# Patient Record
Sex: Male | Born: 1955 | Race: Black or African American | Hispanic: No | Smoking: Never smoker
Health system: Southern US, Community
[De-identification: ages and names within clinical notes are randomized; demographics above are authoritative.]

---

## 2015-10-03 ENCOUNTER — Observation Stay (HOSPITAL_COMMUNITY)
Admission: EM | Admit: 2015-10-03 | Discharge: 2015-10-04 | Disposition: A | Discharge status: Home / Self Care | Payer: Non-veteran care | Attending: General Surgery | Admitting: General Surgery

## 2015-10-03 ENCOUNTER — Emergency Department (HOSPITAL_COMMUNITY): Payer: Non-veteran care

## 2015-10-03 ENCOUNTER — Encounter (HOSPITAL_COMMUNITY): Payer: Self-pay | Admitting: Emergency Medicine

## 2015-10-03 DIAGNOSIS — J982 Interstitial emphysema: Secondary | ICD-10-CM | POA: Diagnosis not present

## 2015-10-03 DIAGNOSIS — S129XXA Fracture of neck, unspecified, initial encounter: Secondary | ICD-10-CM | POA: Diagnosis present

## 2015-10-03 DIAGNOSIS — S12601A Unspecified nondisplaced fracture of seventh cervical vertebra, initial encounter for closed fracture: Secondary | ICD-10-CM

## 2015-10-03 DIAGNOSIS — Z880 Allergy status to penicillin: Secondary | ICD-10-CM | POA: Insufficient documentation

## 2015-10-03 DIAGNOSIS — W14XXXA Fall from tree, initial encounter: Secondary | ICD-10-CM | POA: Diagnosis not present

## 2015-10-03 DIAGNOSIS — S2232XA Fracture of one rib, left side, initial encounter for closed fracture: Principal | ICD-10-CM

## 2015-10-03 DIAGNOSIS — W11XXXA Fall on and from ladder, initial encounter: Secondary | ICD-10-CM

## 2015-10-03 DIAGNOSIS — W1789XA Other fall from one level to another, initial encounter: Secondary | ICD-10-CM

## 2015-10-03 LAB — COMPREHENSIVE METABOLIC PANEL
ALT: 27 U/L (ref 17–63)
ANION GAP: 7 (ref 5–15)
AST: 41 U/L (ref 15–41)
Albumin: 3.7 g/dL (ref 3.5–5.0)
Alkaline Phosphatase: 65 U/L (ref 38–126)
BILIRUBIN TOTAL: 0.6 mg/dL (ref 0.3–1.2)
BUN: 22 mg/dL — AB (ref 6–20)
CO2: 24 mmol/L (ref 22–32)
Calcium: 8.9 mg/dL (ref 8.9–10.3)
Chloride: 106 mmol/L (ref 101–111)
Creatinine, Ser: 1.31 mg/dL — ABNORMAL HIGH (ref 0.61–1.24)
GFR, EST NON AFRICAN AMERICAN: 58 mL/min — AB (ref >60)
Glucose, Bld: 145 mg/dL — ABNORMAL HIGH (ref 65–99)
POTASSIUM: 3.6 mmol/L (ref 3.5–5.1)
Sodium: 137 mmol/L (ref 135–145)
TOTAL PROTEIN: 6.1 g/dL — AB (ref 6.5–8.1)

## 2015-10-03 LAB — CBC WITH DIFFERENTIAL/PLATELET
BASOS ABS: 0 10*3/uL (ref 0.0–0.1)
Basophils Relative: 0 %
EOS ABS: 0.1 10*3/uL (ref 0.0–0.7)
EOS PCT: 1 %
HCT: 37.9 % — ABNORMAL LOW (ref 39.0–52.0)
Hemoglobin: 12.5 g/dL — ABNORMAL LOW (ref 13.0–17.0)
LYMPHS PCT: 10 %
Lymphs Abs: 0.7 10*3/uL (ref 0.7–4.0)
MCH: 28.3 pg (ref 26.0–34.0)
MCHC: 33 g/dL (ref 30.0–36.0)
MCV: 85.9 fL (ref 78.0–100.0)
Monocytes Absolute: 0.3 10*3/uL (ref 0.1–1.0)
Monocytes Relative: 4 %
Neutro Abs: 6.2 10*3/uL (ref 1.7–7.7)
Neutrophils Relative %: 85 %
PLATELETS: 200 10*3/uL (ref 150–400)
RBC: 4.41 MIL/uL (ref 4.22–5.81)
RDW: 11.9 % (ref 11.5–15.5)
WBC: 7.3 10*3/uL (ref 4.0–10.5)

## 2015-10-03 LAB — PROTIME-INR
INR: 1.1 (ref 0.00–1.49)
PROTHROMBIN TIME: 14.3 s (ref 11.6–15.2)

## 2015-10-03 MED ORDER — ONDANSETRON HCL 4 MG PO TABS: 4.0000 mg | ORAL_TABLET | Freq: Four times a day (QID) | ORAL | Status: DC | PRN

## 2015-10-03 MED ORDER — ENOXAPARIN SODIUM 40 MG/0.4ML ~~LOC~~ SOLN: 40.0000 mg | SUBCUTANEOUS | Status: DC

## 2015-10-03 MED ORDER — HYDROCODONE-ACETAMINOPHEN 10-325 MG PO TABS
0.5000 | ORAL_TABLET | ORAL | Status: DC | PRN
  Filled 2015-10-03: qty 2

## 2015-10-03 MED ORDER — KCL IN DEXTROSE-NACL 20-5-0.45 MEQ/L-%-% IV SOLN
INTRAVENOUS | Status: DC
  Administered 2015-10-03: 21:00:00 via INTRAVENOUS
  Filled 2015-10-03: qty 1000

## 2015-10-03 MED ORDER — DOCUSATE SODIUM 100 MG PO CAPS
100.0000 mg | ORAL_CAPSULE | Freq: Two times a day (BID) | ORAL | Status: DC
  Administered 2015-10-03 – 2015-10-04 (×2): 100 mg via ORAL
  Filled 2015-10-03 (×2): qty 1

## 2015-10-03 MED ORDER — IOPAMIDOL (ISOVUE-300) INJECTION 61%
INTRAVENOUS | Status: AC
  Administered 2015-10-03: 100 mL
  Filled 2015-10-03: qty 100

## 2015-10-03 MED ORDER — HYDROMORPHONE HCL 1 MG/ML IJ SOLN
1.0000 mg | INTRAMUSCULAR | Status: DC | PRN
  Administered 2015-10-03 – 2015-10-04 (×2): 1 mg via INTRAVENOUS
  Filled 2015-10-03 (×2): qty 1

## 2015-10-03 MED ORDER — SODIUM CHLORIDE 0.9 % IV SOLN
INTRAVENOUS | Status: DC
  Administered 2015-10-03: 125 mL/h via INTRAVENOUS

## 2015-10-03 MED ORDER — ONDANSETRON HCL 4 MG/2ML IJ SOLN
4.0000 mg | Freq: Four times a day (QID) | INTRAMUSCULAR | Status: DC | PRN
  Administered 2015-10-04: 4 mg via INTRAVENOUS
  Filled 2015-10-03: qty 2

## 2015-10-03 MED ORDER — MORPHINE SULFATE (PF) 2 MG/ML IV SOLN
2.0000 mg | INTRAVENOUS | Status: DC | PRN
  Administered 2015-10-03: 2 mg via INTRAVENOUS
  Filled 2015-10-03: qty 1

## 2015-10-03 MED ORDER — TRAMADOL HCL 50 MG PO TABS
50.0000 mg | ORAL_TABLET | Freq: Four times a day (QID) | ORAL | Status: DC | PRN
  Administered 2015-10-03: 100 mg via ORAL
  Filled 2015-10-03: qty 2

## 2015-10-03 NOTE — ED Notes (Signed)
Fall from "40 feet" C/Ax4.  Neck and side pain.

## 2015-10-03 NOTE — ED Notes (Signed)
POV, reports 40 foot fall from tree to soft ground, no LOC/vomitig, c/o neck pain (c-collar placed on arrival) and right sided neck pain, LS clear, sats 100%, ambulatory on arrival, neuro intact, VSS

## 2015-10-03 NOTE — H&P (Signed)
Jay White is an 60 y.o. male.   Chief Complaint: Fall HPI: Jay White was ~40 feet up a ladder cutting some limbs when a branch hit the ladder and knocked him off. He landed on soft ground on his back and neck. He did not lose consciousness nor does he have any amnesia. He had the wind knocked out of him but was able to stand and ambulate though he was somewhat dizzy. His son called EMS and he was brought to the ED as a level 2 trauma activation. He c/o some left back pain mainly.  History reviewed. No pertinent past medical history.  History reviewed. No pertinent past surgical history.  No family history on file. Social History:  reports that he has never smoked. He does not have any smokeless tobacco history on file. He reports that he drinks alcohol. His drug history is not on file.  Allergies:  Allergies  Allergen Reactions  . Codeine Nausea Only  . Penicillins Nausea And Vomiting    Results for orders placed or performed during the hospital encounter of 10/03/15 (from the past 48 hour(s))  Comprehensive metabolic panel     Status: Abnormal   Collection Time: 10/03/15  1:00 PM  Result Value Ref Range   Sodium 137 135 - 145 mmol/L   Potassium 3.6 3.5 - 5.1 mmol/L   Chloride 106 101 - 111 mmol/L   CO2 24 22 - 32 mmol/L   Glucose, Bld 145 (H) 65 - 99 mg/dL   BUN 22 (H) 6 - 20 mg/dL   Creatinine, Ser 1.31 (H) 0.61 - 1.24 mg/dL   Calcium 8.9 8.9 - 10.3 mg/dL   Total Protein 6.1 (L) 6.5 - 8.1 g/dL   Albumin 3.7 3.5 - 5.0 g/dL   AST 41 15 - 41 U/L   ALT 27 17 - 63 U/L   Alkaline Phosphatase 65 38 - 126 U/L   Total Bilirubin 0.6 0.3 - 1.2 mg/dL   GFR calc non Af Amer 58 (L) >60 mL/min   GFR calc Af Amer >60 >60 mL/min    Comment: (NOTE) The eGFR has been calculated using the CKD EPI equation. This calculation has not been validated in all clinical situations. eGFR's persistently <60 mL/min signify possible Chronic Kidney Disease.    Anion gap 7 5 - 15  CBC with Differential      Status: Abnormal   Collection Time: 10/03/15  1:00 PM  Result Value Ref Range   WBC 7.3 4.0 - 10.5 K/uL   RBC 4.41 4.22 - 5.81 MIL/uL   Hemoglobin 12.5 (L) 13.0 - 17.0 g/dL   HCT 37.9 (L) 39.0 - 52.0 %   MCV 85.9 78.0 - 100.0 fL   MCH 28.3 26.0 - 34.0 pg   MCHC 33.0 30.0 - 36.0 g/dL   RDW 11.9 11.5 - 15.5 %   Platelets 200 150 - 400 K/uL   Neutrophils Relative % 85 %   Neutro Abs 6.2 1.7 - 7.7 K/uL   Lymphocytes Relative 10 %   Lymphs Abs 0.7 0.7 - 4.0 K/uL   Monocytes Relative 4 %   Monocytes Absolute 0.3 0.1 - 1.0 K/uL   Eosinophils Relative 1 %   Eosinophils Absolute 0.1 0.0 - 0.7 K/uL   Basophils Relative 0 %   Basophils Absolute 0.0 0.0 - 0.1 K/uL  Protime-INR     Status: None   Collection Time: 10/03/15  1:00 PM  Result Value Ref Range   Prothrombin Time 14.3 11.6 - 15.2  seconds   INR 1.10 0.00 - 1.49   Ct Chest W Contrast  10/03/2015  CLINICAL DATA:  Pain after 40 foot fall. EXAM: CT CHEST, ABDOMEN, AND PELVIS WITH CONTRAST TECHNIQUE: Multidetector CT imaging of the chest, abdomen and pelvis was performed following the standard protocol during bolus administration of intravenous contrast. CONTRAST:  124m ISOVUE-300 IOPAMIDOL (ISOVUE-300) INJECTION 61% COMPARISON:  None. FINDINGS: CT CHEST There is air in the neck just to the right and posterior to the trachea on series 4, image 8. There is also abnormal air in the subcarinal region as seen on series 4, image 54. There is a focus of gas in the mediastinum posterior to the aorta on series 4, image 107 and another on series 4, 120. There is a tiny amount of gas in the retrocrural region anterior to the aorta on series 3, image 48. A focus of air anterior to the aorta on sagittal image 91 could be a small amount of pleural air. No abnormal fluid collections are seen in the mediastinum. The remainder of the central airways are normal. There is no other evidence of pneumothorax. There is mild dependent atelectasis bilaterally. No  suspicious pulmonary nodules, masses, or infiltrates are identified. The study was not tailored to evaluate the thoracic aorta but there is no aneurysm or dissection identified. No pleural or pericardial effusions. Mild coronary artery calcifications are seen. The heart is otherwise normal. No adenopathy is seen in the chest. CT ABDOMEN AND PELVIS Evaluation of the abdomen demonstrates a tiny amount of retrocrural bear as described above. This is probably tracking inferiorly from the chest. No other abnormal air seen in the abdomen. No free fluid. The liver, gallbladder, portal vein, spleen, adrenal glands, kidneys, and pancreas are normal. The abdominal aorta is normal in caliber. No adenopathy. The stomach and small bowel are normal. The colon demonstrates mild fecal loading but is otherwise normal. The appendix is not well seen but there is no secondary evidence of appendicitis. The pelvis demonstrates no adenopathy or mass. The bladder is normal. Delayed imaging through the upper abdomen demonstrates no filling defects in the upper renal collecting systems. There is a mildly displaced fracture of the left posterior tenth rib. Degenerative changes are seen at the right sternoclavicular joint. There are degenerative changes within the spine. No other fractures are identified. IMPRESSION: 1. There is abnormal air in the mediastinum. The largest pocket of air is in the subcarinal region adjacent to the trachea. However, there is no obvious source of air identified and no obvious injury to the esophagus or trachea is seen. Recommend clinical correlation and follow-up as clinically warranted. No abnormal fluid in the mediastinum. The air from the chest tracks inferiorly into the retrocrural region as described above. 2. Left posterior tenth rib fracture. 3. There is a tiny focus of air medially in the left chest described above which may represent a tiny focus of pleural air representing a tiny pneumothorax best seen  on coronal image 63. Findings called to Dr. PJohnney Killian Electronically Signed   By: DDorise BullionIII M.D   On: 10/03/2015 14:36   Ct Cervical Spine Wo Contrast  10/03/2015  CLINICAL DATA:  Status post fall 40 feet off a ladder. EXAM: CT CERVICAL SPINE WITHOUT CONTRAST TECHNIQUE: Multidetector CT imaging of the cervical spine was performed without intravenous contrast. Multiplanar CT image reconstructions were also generated. COMPARISON:  None. FINDINGS: The alignment is anatomic. There is a mild acute burst fracture of the C7 vertebral body with  minimal anterior height loss without significant retropulsion of the posterior margin. There is a small acute nondisplaced fracture of the right anterior inferior corner of the C4 vertebral body. There is no static listhesis. The prevertebral soft tissues are normal. The intraspinal soft tissues are not fully imaged on this examination due to poor soft tissue contrast, but there is no gross soft tissue abnormality. Degenerative disc disease with disc height loss at C5-6. Broad-based disc osteophyte complex at C5-6 with bilateral uncovertebral degenerative changes and foraminal narrowing. The visualized portions of the lung apices demonstrate no focal abnormality. IMPRESSION: 1. Mild acute burst fracture of the C7 vertebral body with minimal anterior height loss without significant retropulsion of the posterior margin. 2. Acute nondisplaced fracture of the right anterior inferior corner of the C4 vertebral body. Electronically Signed   By: Kathreen Devoid   On: 10/03/2015 14:13   Dg Pelvis Portable  10/03/2015  CLINICAL DATA:  Pain after fall EXAM: PORTABLE PELVIS 1-2 VIEWS COMPARISON:  None. FINDINGS: There is no evidence of pelvic fracture or diastasis. No pelvic bone lesions are seen. IMPRESSION: Negative. Electronically Signed   By: Dorise Bullion III M.D   On: 10/03/2015 12:40   Dg Chest Portable 1 View  10/03/2015  CLINICAL DATA:  Pain after fall EXAM: PORTABLE  CHEST 1 VIEW COMPARISON:  None. FINDINGS: The cardiac silhouette is mildly enlarged. The hila are symmetric. The mediastinum is not well assessed on a portable film but thought to be within normal limits. No pneumothorax. No pulmonary nodules, masses, or focal infiltrates. No fractures are seen. IMPRESSION: 1. Cardiac size is not well assessed on a portable film but the cardiac silhouette does appear to be mildly enlarged. A PA and lateral chest x-ray could better evaluate. 2. No other acute abnormalities are seen. Electronically Signed   By: Dorise Bullion III M.D   On: 10/03/2015 12:39    Review of Systems  Constitutional: Negative for weight loss.  HENT: Negative for ear discharge, ear pain, hearing loss and tinnitus.   Eyes: Negative for blurred vision, double vision, photophobia and pain.  Respiratory: Positive for shortness of breath (Resolved). Negative for cough and sputum production.   Cardiovascular: Negative for chest pain.  Gastrointestinal: Negative for nausea, vomiting and abdominal pain.  Genitourinary: Negative for dysuria, urgency, frequency and flank pain.  Musculoskeletal: Positive for neck pain. Negative for myalgias, back pain, joint pain and falls.  Neurological: Positive for dizziness (Resolved). Negative for tingling, sensory change, focal weakness, loss of consciousness and headaches.  Endo/Heme/Allergies: Does not bruise/bleed easily.  Psychiatric/Behavioral: Negative for depression, memory loss and substance abuse. The patient is not nervous/anxious.     Blood pressure 152/85, pulse 62, temperature 97 F (36.1 C), temperature source Oral, resp. rate 16, height _0  (1.702 m), weight 65.772 kg (145 lb), SpO2 100 %. Physical Exam  Vitals reviewed. Constitutional: He is oriented to person, place, and time. He appears well-developed and well-nourished. He is cooperative. No distress. Cervical collar and nasal cannula in place.  HENT:  Head: Normocephalic and  atraumatic. Head is without raccoon's eyes, without Battle's sign, without abrasion, without contusion and without laceration.  Right Ear: Hearing, tympanic membrane, external ear and ear canal normal. No lacerations. No drainage or tenderness. No foreign bodies. Tympanic membrane is not perforated. No hemotympanum.  Left Ear: Hearing, tympanic membrane, external ear and ear canal normal. No lacerations. No drainage or tenderness. No foreign bodies. Tympanic membrane is not perforated. No hemotympanum.  Nose: Nose  normal. No nose lacerations, sinus tenderness, nasal deformity or nasal septal hematoma. No epistaxis.  Mouth/Throat: Uvula is midline, oropharynx is clear and moist and mucous membranes are normal. No lacerations. No oropharyngeal exudate.  Eyes: Conjunctivae, EOM and lids are normal. Pupils are equal, round, and reactive to light. Right eye exhibits no discharge. Left eye exhibits no discharge. No scleral icterus.  Neck: Trachea normal. No JVD present. No spinous process tenderness and no muscular tenderness present. Carotid bruit is not present. No tracheal deviation present. No thyromegaly present.  Cardiovascular: Normal rate, regular rhythm, normal heart sounds, intact distal pulses and normal pulses.  Exam reveals no gallop and no friction rub.   No murmur heard. Respiratory: Effort normal and breath sounds normal. No stridor. No respiratory distress. He has no wheezes. He has no rales. He exhibits no tenderness, no bony tenderness, no laceration and no crepitus.  GI: Soft. Normal appearance and bowel sounds are normal. He exhibits no distension. There is no tenderness. There is no rigidity, no rebound, no guarding and no CVA tenderness.  Musculoskeletal: Normal range of motion. He exhibits no edema or tenderness.  Lymphadenopathy:    He has no cervical adenopathy.  Neurological: He is alert and oriented to person, place, and time. He has normal strength. No cranial nerve deficit or  sensory deficit. GCS eye subscore is 4. GCS verbal subscore is 5. GCS motor subscore is 6.  Skin: Skin is warm, dry and intact. He is not diaphoretic.  Psychiatric: He has a normal mood and affect. His speech is normal and behavior is normal.     Assessment/Plan Fall Multiple c-spine fxs -- Neurosurgery Vertell Limber) to consult Mediastinal air -- Uncertain provenance and significance Left rib fx -- Pulmonary toilet  Admit to trauma service, NS to consult    Lisette Abu, PA-C Pager: [phone] General Trauma [place] Pager: [phone] 10/03/2015, 3:04 PM

## 2015-10-03 NOTE — Progress Notes (Signed)
  Pt admitted to the unit. Pt is stable, alert and oriented per baseline. Oriented to room, staff, and call bell. Educated to call for any assistance. Bed in lowest position, call bell within reach- will continue to monitor. 

## 2015-10-03 NOTE — Progress Notes (Signed)
Patient's b/p is elevated at this time- patient states that he is in pain and to recheck in about an hour. Will pass on to night RN

## 2015-10-03 NOTE — Consult Note (Signed)
Reason for Consult:C4 and C 7 fractures Referring Physician: Erik Burkett is an 60 y.o. male.  HPI: Ahmarion was ~40 feet up a ladder cutting some limbs when a branch hit the ladder and knocked him off. He landed on soft ground on his back and neck. He did not lose consciousness nor does he have any amnesia. He had the wind knocked out of him but was able to stand and ambulate though he was somewhat dizzy. His son called EMS and he was brought to the ED as a level 2 trauma activation. He c/o some left back pain mainly.  He has mild neck soreness.  History reviewed. No pertinent past medical history.  History reviewed. No pertinent past surgical history.  History reviewed. No pertinent family history.  Social History:  reports that he has never smoked. He does not have any smokeless tobacco history on file. He reports that he drinks alcohol. His drug history is not on file.  Allergies:  Allergies  Allergen Reactions  . Codeine Nausea Only  . Penicillins Nausea And Vomiting    Medications: I have reviewed the patient's current medications.  Results for orders placed or performed during the hospital encounter of 10/03/15 (from the past 48 hour(s))  Comprehensive metabolic panel     Status: Abnormal   Collection Time: 10/03/15  1:00 PM  Result Value Ref Range   Sodium 137 135 - 145 mmol/L   Potassium 3.6 3.5 - 5.1 mmol/L   Chloride 106 101 - 111 mmol/L   CO2 24 22 - 32 mmol/L   Glucose, Bld 145 (H) 65 - 99 mg/dL   BUN 22 (H) 6 - 20 mg/dL   Creatinine, Ser 1.31 (H) 0.61 - 1.24 mg/dL   Calcium 8.9 8.9 - 10.3 mg/dL   Total Protein 6.1 (L) 6.5 - 8.1 g/dL   Albumin 3.7 3.5 - 5.0 g/dL   AST 41 15 - 41 U/L   ALT 27 17 - 63 U/L   Alkaline Phosphatase 65 38 - 126 U/L   Total Bilirubin 0.6 0.3 - 1.2 mg/dL   GFR calc non Af Amer 58 (L) >60 mL/min   GFR calc Af Amer >60 >60 mL/min    Comment: (NOTE) The eGFR has been calculated using the CKD EPI equation. This calculation has  not been validated in all clinical situations. eGFR's persistently <60 mL/min signify possible Chronic Kidney Disease.    Anion gap 7 5 - 15  CBC with Differential     Status: Abnormal   Collection Time: 10/03/15  1:00 PM  Result Value Ref Range   WBC 7.3 4.0 - 10.5 K/uL   RBC 4.41 4.22 - 5.81 MIL/uL   Hemoglobin 12.5 (L) 13.0 - 17.0 g/dL   HCT 37.9 (L) 39.0 - 52.0 %   MCV 85.9 78.0 - 100.0 fL   MCH 28.3 26.0 - 34.0 pg   MCHC 33.0 30.0 - 36.0 g/dL   RDW 11.9 11.5 - 15.5 %   Platelets 200 150 - 400 K/uL   Neutrophils Relative % 85 %   Neutro Abs 6.2 1.7 - 7.7 K/uL   Lymphocytes Relative 10 %   Lymphs Abs 0.7 0.7 - 4.0 K/uL   Monocytes Relative 4 %   Monocytes Absolute 0.3 0.1 - 1.0 K/uL   Eosinophils Relative 1 %   Eosinophils Absolute 0.1 0.0 - 0.7 K/uL   Basophils Relative 0 %   Basophils Absolute 0.0 0.0 - 0.1 K/uL  Protime-INR  Status: None   Collection Time: 10/03/15  1:00 PM  Result Value Ref Range   Prothrombin Time 14.3 11.6 - 15.2 seconds   INR 1.10 0.00 - 1.49    Ct Chest W Contrast  10/03/2015  CLINICAL DATA:  Pain after 40 foot fall. EXAM: CT CHEST, ABDOMEN, AND PELVIS WITH CONTRAST TECHNIQUE: Multidetector CT imaging of the chest, abdomen and pelvis was performed following the standard protocol during bolus administration of intravenous contrast. CONTRAST:  152m ISOVUE-300 IOPAMIDOL (ISOVUE-300) INJECTION 61% COMPARISON:  None. FINDINGS: CT CHEST There is air in the neck just to the right and posterior to the trachea on series 4, image 8. There is also abnormal air in the subcarinal region as seen on series 4, image 54. There is a focus of gas in the mediastinum posterior to the aorta on series 4, image 107 and another on series 4, 120. There is a tiny amount of gas in the retrocrural region anterior to the aorta on series 3, image 48. A focus of air anterior to the aorta on sagittal image 91 could be a small amount of pleural air. No abnormal fluid collections are  seen in the mediastinum. The remainder of the central airways are normal. There is no other evidence of pneumothorax. There is mild dependent atelectasis bilaterally. No suspicious pulmonary nodules, masses, or infiltrates are identified. The study was not tailored to evaluate the thoracic aorta but there is no aneurysm or dissection identified. No pleural or pericardial effusions. Mild coronary artery calcifications are seen. The heart is otherwise normal. No adenopathy is seen in the chest. CT ABDOMEN AND PELVIS Evaluation of the abdomen demonstrates a tiny amount of retrocrural bear as described above. This is probably tracking inferiorly from the chest. No other abnormal air seen in the abdomen. No free fluid. The liver, gallbladder, portal vein, spleen, adrenal glands, kidneys, and pancreas are normal. The abdominal aorta is normal in caliber. No adenopathy. The stomach and small bowel are normal. The colon demonstrates mild fecal loading but is otherwise normal. The appendix is not well seen but there is no secondary evidence of appendicitis. The pelvis demonstrates no adenopathy or mass. The bladder is normal. Delayed imaging through the upper abdomen demonstrates no filling defects in the upper renal collecting systems. There is a mildly displaced fracture of the left posterior tenth rib. Degenerative changes are seen at the right sternoclavicular joint. There are degenerative changes within the spine. No other fractures are identified. IMPRESSION: 1. There is abnormal air in the mediastinum. The largest pocket of air is in the subcarinal region adjacent to the trachea. However, there is no obvious source of air identified and no obvious injury to the esophagus or trachea is seen. Recommend clinical correlation and follow-up as clinically warranted. No abnormal fluid in the mediastinum. The air from the chest tracks inferiorly into the retrocrural region as described above. 2. Left posterior tenth rib  fracture. 3. There is a tiny focus of air medially in the left chest described above which may represent a tiny focus of pleural air representing a tiny pneumothorax best seen on coronal image 63. Findings called to Dr. PJohnney Killian Electronically Signed   By: DDorise BullionIII M.D   On: 10/03/2015 14:36   Ct Cervical Spine Wo Contrast  10/03/2015  CLINICAL DATA:  Status post fall 40 feet off a ladder. EXAM: CT CERVICAL SPINE WITHOUT CONTRAST TECHNIQUE: Multidetector CT imaging of the cervical spine was performed without intravenous contrast. Multiplanar CT image reconstructions  were also generated. COMPARISON:  None. FINDINGS: The alignment is anatomic. There is a mild acute burst fracture of the C7 vertebral body with minimal anterior height loss without significant retropulsion of the posterior margin. There is a small acute nondisplaced fracture of the right anterior inferior corner of the C4 vertebral body. There is no static listhesis. The prevertebral soft tissues are normal. The intraspinal soft tissues are not fully imaged on this examination due to poor soft tissue contrast, but there is no gross soft tissue abnormality. Degenerative disc disease with disc height loss at C5-6. Broad-based disc osteophyte complex at C5-6 with bilateral uncovertebral degenerative changes and foraminal narrowing. The visualized portions of the lung apices demonstrate no focal abnormality. IMPRESSION: 1. Mild acute burst fracture of the C7 vertebral body with minimal anterior height loss without significant retropulsion of the posterior margin. 2. Acute nondisplaced fracture of the right anterior inferior corner of the C4 vertebral body. Electronically Signed   By: Kathreen Devoid   On: 10/03/2015 14:13   Ct Abdomen Pelvis W Contrast  10/03/2015  CLINICAL DATA:  Pain after 40 foot fall. EXAM: CT CHEST, ABDOMEN, AND PELVIS WITH CONTRAST TECHNIQUE: Multidetector CT imaging of the chest, abdomen and pelvis was performed following  the standard protocol during bolus administration of intravenous contrast. CONTRAST:  1104m ISOVUE-300 IOPAMIDOL (ISOVUE-300) INJECTION 61% COMPARISON:  None. FINDINGS: CT CHEST There is air in the neck just to the right and posterior to the trachea on series 4, image 8. There is also abnormal air in the subcarinal region as seen on series 4, image 54. There is a focus of gas in the mediastinum posterior to the aorta on series 4, image 107 and another on series 4, 120. There is a tiny amount of gas in the retrocrural region anterior to the aorta on series 3, image 48. A focus of air anterior to the aorta on sagittal image 91 could be a small amount of pleural air. No abnormal fluid collections are seen in the mediastinum. The remainder of the central airways are normal. There is no other evidence of pneumothorax. There is mild dependent atelectasis bilaterally. No suspicious pulmonary nodules, masses, or infiltrates are identified. The study was not tailored to evaluate the thoracic aorta but there is no aneurysm or dissection identified. No pleural or pericardial effusions. Mild coronary artery calcifications are seen. The heart is otherwise normal. No adenopathy is seen in the chest. CT ABDOMEN AND PELVIS Evaluation of the abdomen demonstrates a tiny amount of retrocrural bear as described above. This is probably tracking inferiorly from the chest. No other abnormal air seen in the abdomen. No free fluid. The liver, gallbladder, portal vein, spleen, adrenal glands, kidneys, and pancreas are normal. The abdominal aorta is normal in caliber. No adenopathy. The stomach and small bowel are normal. The colon demonstrates mild fecal loading but is otherwise normal. The appendix is not well seen but there is no secondary evidence of appendicitis. The pelvis demonstrates no adenopathy or mass. The bladder is normal. Delayed imaging through the upper abdomen demonstrates no filling defects in the upper renal collecting  systems. There is a mildly displaced fracture of the left posterior tenth rib. Degenerative changes are seen at the right sternoclavicular joint. There are degenerative changes within the spine. No other fractures are identified. IMPRESSION: 1. There is abnormal air in the mediastinum. The largest pocket of air is in the subcarinal region adjacent to the trachea. However, there is no obvious source of air identified  and no obvious injury to the esophagus or trachea is seen. Recommend clinical correlation and follow-up as clinically warranted. No abnormal fluid in the mediastinum. The air from the chest tracks inferiorly into the retrocrural region as described above. 2. Left posterior tenth rib fracture. 3. There is a tiny focus of air medially in the left chest described above which may represent a tiny focus of pleural air representing a tiny pneumothorax best seen on coronal image 63. Findings called to Dr. Johnney Killian. Electronically Signed   By: Dorise Bullion III M.D   On: 10/03/2015 14:36   Dg Pelvis Portable  10/03/2015  CLINICAL DATA:  Pain after fall EXAM: PORTABLE PELVIS 1-2 VIEWS COMPARISON:  None. FINDINGS: There is no evidence of pelvic fracture or diastasis. No pelvic bone lesions are seen. IMPRESSION: Negative. Electronically Signed   By: Dorise Bullion III M.D   On: 10/03/2015 12:40   Dg Chest Portable 1 View  10/03/2015  CLINICAL DATA:  Pain after fall EXAM: PORTABLE CHEST 1 VIEW COMPARISON:  None. FINDINGS: The cardiac silhouette is mildly enlarged. The hila are symmetric. The mediastinum is not well assessed on a portable film but thought to be within normal limits. No pneumothorax. No pulmonary nodules, masses, or focal infiltrates. No fractures are seen. IMPRESSION: 1. Cardiac size is not well assessed on a portable film but the cardiac silhouette does appear to be mildly enlarged. A PA and lateral chest x-ray could better evaluate. 2. No other acute abnormalities are seen. Electronically  Signed   By: Dorise Bullion III M.D   On: 10/03/2015 12:39    Review of Systems - Negative except As above    Blood pressure 162/96, pulse 66, temperature 98.3 F (36.8 C), temperature source Oral, resp. rate 16, height 5' 7"  (1.702 m), weight 62.415 kg (137 lb 9.6 oz), SpO2 100 %. Physical Exam  Constitutional: He is oriented to person, place, and time. He appears well-developed and well-nourished.  HENT:  Head: Normocephalic and atraumatic.  Eyes: EOM are normal. Pupils are equal, round, and reactive to light.  Neck:  In cervical collar.  Minimal soft tissue swelling.  Neurological: He is alert and oriented to person, place, and time. He has normal strength. He displays normal reflexes. No cranial nerve deficit or sensory deficit. GCS eye subscore is 4. GCS verbal subscore is 5. GCS motor subscore is 6.  Reflex Scores:      Tricep reflexes are 2+ on the right side and 2+ on the left side.      Bicep reflexes are 2+ on the right side and 2+ on the left side.      Brachioradialis reflexes are 2+ on the right side and 2+ on the left side.      Patellar reflexes are 2+ on the right side and 2+ on the left side.      Achilles reflexes are 2+ on the right side and 2+ on the left side. Skin: Skin is warm and dry.  Psychiatric: He has a normal mood and affect. His behavior is normal. Judgment and thought content normal.    Assessment/Plan: Patient has fractures of C 4 and C 7 without malalignment.  Continue collar.  Follow up with Xrays in office in 3 weeks.  No need for surgery.  Peggyann Shoals, MD 10/03/2015, 7:30 PM

## 2015-10-03 NOTE — ED Notes (Signed)
Aspen collar placed, pt's neuro status remains intact

## 2015-10-03 NOTE — ED Provider Notes (Signed)
CSN: [id number]     Arrival date & time 10/03/15  1205 History   First MD Initiated Contact with Patient 10/03/15 1243     Chief Complaint  Patient presents with  . Trauma     (Consider location/radiation/quality/duration/timing/severity/associated sxs/prior Treatment) HPI Patient states that he was trimming a tree and he was about 40 feet off the ground. A limb fell and knocked the ladder out from underneath him. He states he was able to throw his chainsaw away from him and was not injured by. He reports he did land on the ground which had leaves on it and was not particularly hard. He does however report that he struck the ground and his neck twisted at an odd angle. He denies loss of consciousness. He denies headache, nausea or vomiting. Patient does not have any paresthesia or motor weakness. He reports there was none present at that time of episode. He does continue to have pain in the back of his neck. He also reports he really got the breath knocked out of him and he has pain in his left flank area. He reports as worse if he tries to take a deep breath or move. He denies lower abdominal pain or pelvis pain. He reports he can move his legs without difficulty. History reviewed. No pertinent past medical history. History reviewed. No pertinent past surgical history. No family history on file. Social History  Substance Use Topics  . Smoking status: Never Smoker   . Smokeless tobacco: None  . Alcohol Use: Yes    Review of Systems  10 Systems reviewed and are negative for acute change except as noted in the HPI.   Allergies  Codeine and Penicillins  Home Medications   Prior to Admission medications   Not on File   BP 149/90 mmHg  Pulse 65  Temp(Src) 97 F (36.1 C) (Oral)  Resp 16  Ht 5\' 7"  (1.702 m)  Wt 145 lb (65.772 kg)  BMI 22.71 kg/m2  SpO2 98% Physical Exam  Constitutional: He is oriented to person, place, and time. He appears well-developed and well-nourished.   Patient is well-nourished well-developed. He is not in acute distress. Respirations are nonlabored. Color is good. Mental status is clear.  HENT:  Head: Normocephalic and atraumatic.  Right Ear: External ear normal.  Left Ear: External ear normal.  Nose: Nose normal.  Mouth/Throat: Oropharynx is clear and moist.  Eyes: EOM are normal. Pupils are equal, round, and reactive to light.  Neck: Neck supple.  Patient is maintained in cervical collar. He does endorse discomfort with palpation on the vertebral bodies C4 and 5. Anterior neck is without soft tissue swelling or tracheal deviation. No stridor.  Cardiovascular: Normal rate, regular rhythm, normal heart sounds and intact distal pulses.   Pulmonary/Chest: Effort normal and breath sounds normal. He exhibits tenderness.  Patient versus pain on left lateral lower chest wall with deep inspiration and palpation.  Abdominal: Soft. Bowel sounds are normal. He exhibits no distension. There is tenderness.  Left flank pain to palpation. Lower abdomen is soft without guarding.  Musculoskeletal: Normal range of motion. He exhibits no edema or tenderness.  Pelvis stable. Normal range of motion lower extremities. Normal range of motion upper studies.  Neurological: He is alert and oriented to person, place, and time. He has normal strength. Coordination normal. GCS eye subscore is 4. GCS verbal subscore is 5. GCS motor subscore is 6.  Excellent flexion and extension strength lower extremity is. Symmetric grip stings upper  extremity is. No decreased sensation to light touch 4 extremities.  Skin: Skin is warm, dry and intact.  Psychiatric: He has a normal mood and affect.    ED Course  Procedures (including critical care time) Labs Review Labs Reviewed  COMPREHENSIVE METABOLIC PANEL - Abnormal; Notable for the following:    Glucose, Bld 145 (*)    BUN 22 (*)    Creatinine, Ser 1.31 (*)    Total Protein 6.1 (*)    GFR calc non Af Amer 58 (*)     All other components within normal limits  CBC WITH DIFFERENTIAL/PLATELET - Abnormal; Notable for the following:    Hemoglobin 12.5 (*)    HCT 37.9 (*)    All other components within normal limits  PROTIME-INR  URINALYSIS, ROUTINE W REFLEX MICROSCOPIC (NOT [facility])    Imaging Review Ct Chest W Contrast  10/03/2015  CLINICAL DATA:  Pain after 40 foot fall. EXAM: CT CHEST, ABDOMEN, AND PELVIS WITH CONTRAST TECHNIQUE: Multidetector CT imaging of the chest, abdomen and pelvis was performed following the standard protocol during bolus administration of intravenous contrast. CONTRAST:  100mL ISOVUE-300 IOPAMIDOL (ISOVUE-300) INJECTION 61% COMPARISON:  None. FINDINGS: CT CHEST There is air in the neck just to the right and posterior to the trachea on series 4, image 8. There is also abnormal air in the subcarinal region as seen on series 4, image 54. There is a focus of gas in the mediastinum posterior to the aorta on series 4, image 107 and another on series 4, 120. There is a tiny amount of gas in the retrocrural region anterior to the aorta on series 3, image 48. A focus of air anterior to the aorta on sagittal image 91 could be a small amount of pleural air. No abnormal fluid collections are seen in the mediastinum. The remainder of the central airways are normal. There is no other evidence of pneumothorax. There is mild dependent atelectasis bilaterally. No suspicious pulmonary nodules, masses, or infiltrates are identified. The study was not tailored to evaluate the thoracic aorta but there is no aneurysm or dissection identified. No pleural or pericardial effusions. Mild coronary artery calcifications are seen. The heart is otherwise normal. No adenopathy is seen in the chest. CT ABDOMEN AND PELVIS Evaluation of the abdomen demonstrates a tiny amount of retrocrural bear as described above. This is probably tracking inferiorly from the chest. No other abnormal air seen in the abdomen. No free fluid. The  liver, gallbladder, portal vein, spleen, adrenal glands, kidneys, and pancreas are normal. The abdominal aorta is normal in caliber. No adenopathy. The stomach and small bowel are normal. The colon demonstrates mild fecal loading but is otherwise normal. The appendix is not well seen but there is no secondary evidence of appendicitis. The pelvis demonstrates no adenopathy or mass. The bladder is normal. Delayed imaging through the upper abdomen demonstrates no filling defects in the upper renal collecting systems. There is a mildly displaced fracture of the left posterior tenth rib. Degenerative changes are seen at the right sternoclavicular joint. There are degenerative changes within the spine. No other fractures are identified. IMPRESSION: 1. There is abnormal air in the mediastinum. The largest pocket of air is in the subcarinal region adjacent to the trachea. However, there is no obvious source of air identified and no obvious injury to the esophagus or trachea is seen. Recommend clinical correlation and follow-up as clinically warranted. No abnormal fluid in the mediastinum. The air from the chest tracks inferiorly  into the retrocrural region as described above. 2. Left posterior tenth rib fracture. 3. There is a tiny focus of air medially in the left chest described above which may represent a tiny focus of pleural air representing a tiny pneumothorax best seen on coronal image 63. Findings called to Dr. Donnald Garre. Electronically Signed   By: Gerome Sam III M.D   On: 10/03/2015 14:36   Ct Cervical Spine Wo Contrast  10/03/2015  CLINICAL DATA:  Status post fall 40 feet off a ladder. EXAM: CT CERVICAL SPINE WITHOUT CONTRAST TECHNIQUE: Multidetector CT imaging of the cervical spine was performed without intravenous contrast. Multiplanar CT image reconstructions were also generated. COMPARISON:  None. FINDINGS: The alignment is anatomic. There is a mild acute burst fracture of the C7 vertebral body with  minimal anterior height loss without significant retropulsion of the posterior margin. There is a small acute nondisplaced fracture of the right anterior inferior corner of the C4 vertebral body. There is no static listhesis. The prevertebral soft tissues are normal. The intraspinal soft tissues are not fully imaged on this examination due to poor soft tissue contrast, but there is no gross soft tissue abnormality. Degenerative disc disease with disc height loss at C5-6. Broad-based disc osteophyte complex at C5-6 with bilateral uncovertebral degenerative changes and foraminal narrowing. The visualized portions of the lung apices demonstrate no focal abnormality. IMPRESSION: 1. Mild acute burst fracture of the C7 vertebral body with minimal anterior height loss without significant retropulsion of the posterior margin. 2. Acute nondisplaced fracture of the right anterior inferior corner of the C4 vertebral body. Electronically Signed   By: Elige Ko   On: 10/03/2015 14:13   Ct Abdomen Pelvis W Contrast  10/03/2015  CLINICAL DATA:  Pain after 40 foot fall. EXAM: CT CHEST, ABDOMEN, AND PELVIS WITH CONTRAST TECHNIQUE: Multidetector CT imaging of the chest, abdomen and pelvis was performed following the standard protocol during bolus administration of intravenous contrast. CONTRAST:  ISOVUE-300 IOPAMIDOL (ISOVUE-300) INJECTION 61% COMPARISON:  None. FINDINGS: CT CHEST There is air in the neck just to the right and posterior to the trachea on series 4, image 8. There is also abnormal air in the subcarinal region as seen on series 4, image 54. There is a focus of gas in the mediastinum posterior to the aorta on series 4, image 107 and another on series 4, 120. There is a tiny amount of gas in the retrocrural region anterior to the aorta on series 3, image 48. A focus of air anterior to the aorta on sagittal image 91 could be a small amount of pleural air. No abnormal fluid collections are seen in the  mediastinum. The remainder of the central airways are normal. There is no other evidence of pneumothorax. There is mild dependent atelectasis bilaterally. No suspicious pulmonary nodules, masses, or infiltrates are identified. The study was not tailored to evaluate the thoracic aorta but there is no aneurysm or dissection identified. No pleural or pericardial effusions. Mild coronary artery calcifications are seen. The heart is otherwise normal. No adenopathy is seen in the chest. CT ABDOMEN AND PELVIS Evaluation of the abdomen demonstrates a tiny amount of retrocrural bear as described above. This is probably tracking inferiorly from the chest. No other abnormal air seen in the abdomen. No free fluid. The liver, gallbladder, portal vein, spleen, adrenal glands, kidneys, and pancreas are normal. The abdominal aorta is normal in caliber. No adenopathy. The stomach and small bowel are normal. The colon demonstrates mild fecal  loading but is otherwise normal. The appendix is not well seen but there is no secondary evidence of appendicitis. The pelvis demonstrates no adenopathy or mass. The bladder is normal. Delayed imaging through the upper abdomen demonstrates no filling defects in the upper renal collecting systems. There is a mildly displaced fracture of the left posterior tenth rib. Degenerative changes are seen at the right sternoclavicular joint. There are degenerative changes within the spine. No other fractures are identified. IMPRESSION: 1. There is abnormal air in the mediastinum. The largest pocket of air is in the subcarinal region adjacent to the trachea. However, there is no obvious source of air identified and no obvious injury to the esophagus or trachea is seen. Recommend clinical correlation and follow-up as clinically warranted. No abnormal fluid in the mediastinum. The air from the chest tracks inferiorly into the retrocrural region as described above. 2. Left posterior tenth rib fracture. 3. There  is a tiny focus of air medially in the left chest described above which may represent a tiny focus of pleural air representing a tiny pneumothorax best seen on coronal image 63. Findings called to Dr. Donnald Garre. Electronically Signed   By: Gerome Sam III M.D   On: 10/03/2015 14:36   Dg Pelvis Portable  10/03/2015  CLINICAL DATA:  Pain after fall EXAM: PORTABLE PELVIS 1-2 VIEWS COMPARISON:  None. FINDINGS: There is no evidence of pelvic fracture or diastasis. No pelvic bone lesions are seen. IMPRESSION: Negative. Electronically Signed   By: Gerome Sam III M.D   On: 10/03/2015 12:40   Dg Chest Portable 1 View  10/03/2015  CLINICAL DATA:  Pain after fall EXAM: PORTABLE CHEST 1 VIEW COMPARISON:  None. FINDINGS: The cardiac silhouette is mildly enlarged. The hila are symmetric. The mediastinum is not well assessed on a portable film but thought to be within normal limits. No pneumothorax. No pulmonary nodules, masses, or focal infiltrates. No fractures are seen. IMPRESSION: 1. Cardiac size is not well assessed on a portable film but the cardiac silhouette does appear to be mildly enlarged. A PA and lateral chest x-ray could better evaluate. 2. No other acute abnormalities are seen. Electronically Signed   By: Gerome Sam III M.D   On: 10/03/2015 12:39   I have personally reviewed and evaluated these images and lab results as part of my medical decision-making.   EKG Interpretation None     Consult: Reviewed Dr. Janee Morn. Will see if patient in the emergency department for evaluation. Consult: Reviewed Dr. Venetia Maxon neurosurgery. Recommends patient to be maintained in cervical collar. Fracture stable. He will consult on the patient in the emergency department. MDM   Final diagnoses:  Closed nondisplaced fracture of seventh cervical vertebra, unspecified fracture morphology, initial encounter (HCC)  Rib fracture, left, closed, initial encounter  Injury resulting from fall from height   Patient  has intact neurologic examination. He has no respiratory distress. CT findings indicate some intrathoracic air. But no significant pneumothorax. Surgery has been consult it and trauma will admit for observation. I also reviewed the case with Dr. Venetia Maxon of neurosurgery who advises cervical spine fracture is stable and patient is to be maintained cervical collar.    Arby Barrette, MD 10/03/15 [phone]

## 2015-10-03 NOTE — ED Notes (Signed)
Attempted report 

## 2015-10-04 ENCOUNTER — Observation Stay (HOSPITAL_COMMUNITY): Payer: Non-veteran care

## 2015-10-04 DIAGNOSIS — S129XXA Fracture of neck, unspecified, initial encounter: Secondary | ICD-10-CM | POA: Diagnosis present

## 2015-10-04 DIAGNOSIS — W11XXXA Fall on and from ladder, initial encounter: Secondary | ICD-10-CM

## 2015-10-04 LAB — URINALYSIS, ROUTINE W REFLEX MICROSCOPIC
BILIRUBIN URINE: NEGATIVE
Glucose, UA: 100 mg/dL — AB
KETONES UR: NEGATIVE mg/dL
Leukocytes, UA: NEGATIVE
NITRITE: NEGATIVE
PH: 6 (ref 5.0–8.0)
Protein, ur: NEGATIVE mg/dL
Specific Gravity, Urine: 1.037 — ABNORMAL HIGH (ref 1.005–1.030)

## 2015-10-04 LAB — CBC
HCT: 35.9 % — ABNORMAL LOW (ref 39.0–52.0)
HEMOGLOBIN: 11.9 g/dL — AB (ref 13.0–17.0)
MCH: 28.5 pg (ref 26.0–34.0)
MCHC: 33.1 g/dL (ref 30.0–36.0)
MCV: 86.1 fL (ref 78.0–100.0)
Platelets: 200 10*3/uL (ref 150–400)
RBC: 4.17 MIL/uL — AB (ref 4.22–5.81)
RDW: 12.1 % (ref 11.5–15.5)
WBC: 6.3 10*3/uL (ref 4.0–10.5)

## 2015-10-04 LAB — BASIC METABOLIC PANEL
Anion gap: 6 (ref 5–15)
BUN: 17 mg/dL (ref 6–20)
CHLORIDE: 103 mmol/L (ref 101–111)
CO2: 27 mmol/L (ref 22–32)
Calcium: 8.3 mg/dL — ABNORMAL LOW (ref 8.9–10.3)
Creatinine, Ser: 0.84 mg/dL (ref 0.61–1.24)
GFR calc non Af Amer: >60 mL/min (ref >60)
Glucose, Bld: 119 mg/dL — ABNORMAL HIGH (ref 65–99)
POTASSIUM: 3.3 mmol/L — AB (ref 3.5–5.1)
SODIUM: 136 mmol/L (ref 135–145)

## 2015-10-04 LAB — URINE MICROSCOPIC-ADD ON: WBC UA: NONE SEEN WBC/hpf (ref 0–5)

## 2015-10-04 MED ORDER — NAPROXEN 500 MG PO TABS: 500.0000 mg | ORAL_TABLET | Freq: Two times a day (BID) | ORAL | Status: AC

## 2015-10-04 MED ORDER — HYDROCODONE-ACETAMINOPHEN 5-325 MG PO TABS: 1.0000 | ORAL_TABLET | ORAL | Status: AC | PRN

## 2015-10-04 MED ORDER — HYDROCODONE-ACETAMINOPHEN 10-325 MG PO TABS: 0.5000 | ORAL_TABLET | ORAL | Status: DC | PRN

## 2015-10-04 MED ORDER — HYDROMORPHONE HCL 1 MG/ML IJ SOLN: 0.5000 mg | INTRAMUSCULAR | Status: DC | PRN

## 2015-10-04 MED ORDER — NAPROXEN 250 MG PO TABS: 500.0000 mg | ORAL_TABLET | Freq: Two times a day (BID) | ORAL | Status: DC

## 2015-10-04 NOTE — Evaluation (Signed)
Occupational Therapy Evaluation Patient Details Name: Marion Rosenberry MRN: [id number] DOB: [birth year] Today's Date: 10/04/2015    History of Present Illness Patient has fractures of C 4 and C 7 without malalignment as well as 10th L rib fx after fall from a tree   Clinical Impression   Pt admitted with fall. Pt currently with functional limitations due to the deficits listed below (see OT Problem List).  Pt will benefit from skilled OT to increase their safety and independence with ADL and functional mobility for ADL to facilitate discharge to venue listed below.      Follow Up Recommendations  No OT follow up    Equipment Recommendations  None recommended by OT       Precautions / Restrictions Precautions Precautions: Cervical Required Braces or Orthoses: Cervical Brace Cervical Brace: Hard collar;At all times;Other (comment) (has extra pads for showering) Restrictions Weight Bearing Restrictions: No      Mobility Bed Mobility Overal bed mobility: Independent                Transfers Overall transfer level: Needs assistance   Transfers: Sit to/from Stand;Stand Pivot Transfers Sit to Stand: Supervision Stand pivot transfers: Supervision                 ADL Overall ADL's : Needs assistance/impaired                                       General ADL Comments: Pt overall min guard- S for ADL activity. Educated pt on cervical precautions and ADL actvity as well as changing pads for shower.                Pertinent Vitals/Pain Pain Assessment: 0-10 Pain Score: 3  Pain Location: L rib area Pain Descriptors / Indicators: Sore Pain Intervention(s): Limited activity within patient's tolerance;Monitored during session        Extremity/Trunk Assessment Upper Extremity Assessment Upper Extremity Assessment: Overall WFL for tasks assessed (strength NT due to cervical fx)           Communication Communication Communication: No  difficulties   Cognition Arousal/Alertness: Awake/alert Behavior During Therapy: WFL for tasks assessed/performed Overall Cognitive Status: Within Functional Limits for tasks assessed                     General Comments   Ex wife present and will A pt at home. Educated  Ex wife in how to change pads on collar.            Home Living Family/patient expects to be discharged to:: Private residence Living Arrangements: Other relatives Available Help at Discharge: Family Type of Home: House                 Bathroom Toilet: Standard     Home Equipment: None          Prior Functioning/Environment Level of Independence: Independent                      OT Goals(Current goals can be found in the care plan section) Acute Rehab OT Goals Patient Stated Goal: home                End of Session Nurse Communication: Mobility status  Activity Tolerance: Patient tolerated treatment well;No increased pain Patient left: in bed;with call bell/phone within reach;with family/visitor present;with nursing/sitter in room  Time: 1140-1158 OT Time Calculation (min): 18 min Charges:  OT General Charges $OT Visit: 1 Procedure OT Evaluation $OT Eval Low Complexity: 1 Procedure G-Codes: OT G-codes **NOT FOR INPATIENT CLASS** Functional Assessment Tool Used: clinical observation Functional Limitation: Self care Self Care Current Status (Z6109(G8987): At least 20 percent but less than 40 percent impaired, limited or restricted Self Care Goal Status (U0454(G8988): At least 1 percent but less than 20 percent impaired, limited or restricted  Hadleigh Felber, Metro KungLorraine D 10/04/2015, 12:09 PM

## 2015-10-04 NOTE — Progress Notes (Signed)
Patient ID: Jay White, male   DOB: 09/11/1955, 60 y.o.   MRN: [id number]  LOS: 2 days  Subjective: No unexpected c/o. It seems tramadol wasn't strong enough.   Objective: Vital signs in last 24 hours: Temp:  [97 F (36.1 C)-98.3 F (36.8 C)] 97.6 F (36.4 C) (06/09 0620) Pulse Rate:  [55-69] 58 (06/09 0620) Resp:  [16-18] 16 (06/09 0620) BP: (133-182)/(73-96) 133/73 mmHg (06/09 0620) SpO2:  [97 %-100 %] 99 % (06/09 0620) Weight:  [62.415 kg (137 lb 9.6 oz)-65.772 kg (145 lb)] 62.415 kg (137 lb 9.6 oz) (06/08 1807) Last BM Date: 10/03/15   IS: 2000ml   Laboratory  CBC  Recent Labs  10/03/15 1300 10/04/15 0232  WBC 7.3 6.3  HGB 12.5* 11.9*  HCT 37.9* 35.9*  PLT 200 200   BMET  Recent Labs  10/03/15 1300 10/04/15 0232  NA 137 136  K 3.6 3.3*  CL 106 103  CO2 24 27  GLUCOSE 145* 119*  BUN 22* 17  CREATININE 1.31* 0.84  CALCIUM 8.9 8.3*    Physical Exam General appearance: alert and no distress Resp: clear to auscultation bilaterally Cardio: regular rate and rhythm GI: normal findings: bowel sounds normal and soft, non-tender   Assessment/Plan: Fall Multiple c-spine fxs -- collar per Dr. Venetia MaxonStern Left rib fx -- Pulmonary toilet FEN -- Change tramadol to Norco VTE -- SCD's, Lovenox Dispo -- Likely home today after PT/OT if Norco controls pain    Freeman CaldronMichael J. Vicki Chaffin, PA-C Pager: [phone] General Trauma PA Pager: [phone]  10/04/2015

## 2015-10-04 NOTE — Progress Notes (Signed)
Subjective: Patient reports "I had a rough day yesterday, but it could've been worse"  Objective: Vital signs in last 24 hours: Temp:  [97 F (36.1 C)-98.3 F (36.8 C)] 97.6 F (36.4 C) (06/09 0620) Pulse Rate:  [55-69] 58 (06/09 0620) Resp:  [16-18] 16 (06/09 0620) BP: (133-182)/(73-96) 133/73 mmHg (06/09 0620) SpO2:  [97 %-100 %] 99 % (06/09 0620) Weight:  [62.415 kg (137 lb 9.6 oz)-65.772 kg (145 lb)] 62.415 kg (137 lb 9.6 oz) (06/08 1807)  Intake/Output from previous day: 06/08 0701 - 06/09 0700 In: 1222.1 [P.O.:340; I.V.:882.1] Out: 1150 [Urine:1150] Intake/Output this shift:    Alert reporting mild left side rib pain. Vista collar in use. Strength is good BUE, BLE. He reports no neck/shoulder pain at present.   Lab Results:  Recent Labs  10/03/15 1300 10/04/15 0232  WBC 7.3 6.3  HGB 12.5* 11.9*  HCT 37.9* 35.9*  PLT 200 200   BMET  Recent Labs  10/03/15 1300 10/04/15 0232  NA 137 136  K 3.6 3.3*  CL 106 103  CO2 24 27  GLUCOSE 145* 119*  BUN 22* 17  CREATININE 1.31* 0.84  CALCIUM 8.9 8.3*    Studies/Results: Ct Chest W Contrast  10/03/2015  CLINICAL DATA:  Pain after 40 foot fall. EXAM: CT CHEST, ABDOMEN, AND PELVIS WITH CONTRAST TECHNIQUE: Multidetector CT imaging of the chest, abdomen and pelvis was performed following the standard protocol during bolus administration of intravenous contrast. CONTRAST:  ISOVUE-300 IOPAMIDOL (ISOVUE-300) INJECTION 61% COMPARISON:  None. FINDINGS: CT CHEST There is air in the neck just to the right and posterior to the trachea on series 4, image 8. There is also abnormal air in the subcarinal region as seen on series 4, image 54. There is a focus of gas in the mediastinum posterior to the aorta on series 4, image 107 and another on series 4, 120. There is a tiny amount of gas in the retrocrural region anterior to the aorta on series 3, image 48. A focus of air anterior to the aorta on sagittal image 91 could be a small  amount of pleural air. No abnormal fluid collections are seen in the mediastinum. The remainder of the central airways are normal. There is no other evidence of pneumothorax. There is mild dependent atelectasis bilaterally. No suspicious pulmonary nodules, masses, or infiltrates are identified. The study was not tailored to evaluate the thoracic aorta but there is no aneurysm or dissection identified. No pleural or pericardial effusions. Mild coronary artery calcifications are seen. The heart is otherwise normal. No adenopathy is seen in the chest. CT ABDOMEN AND PELVIS Evaluation of the abdomen demonstrates a tiny amount of retrocrural bear as described above. This is probably tracking inferiorly from the chest. No other abnormal air seen in the abdomen. No free fluid. The liver, gallbladder, portal vein, spleen, adrenal glands, kidneys, and pancreas are normal. The abdominal aorta is normal in caliber. No adenopathy. The stomach and small bowel are normal. The colon demonstrates mild fecal loading but is otherwise normal. The appendix is not well seen but there is no secondary evidence of appendicitis. The pelvis demonstrates no adenopathy or mass. The bladder is normal. Delayed imaging through the upper abdomen demonstrates no filling defects in the upper renal collecting systems. There is a mildly displaced fracture of the left posterior tenth rib. Degenerative changes are seen at the right sternoclavicular joint. There are degenerative changes within the spine. No other fractures are identified. IMPRESSION: 1. There is abnormal  air in the mediastinum. The largest pocket of air is in the subcarinal region adjacent to the trachea. However, there is no obvious source of air identified and no obvious injury to the esophagus or trachea is seen. Recommend clinical correlation and follow-up as clinically warranted. No abnormal fluid in the mediastinum. The air from the chest tracks inferiorly into the retrocrural  region as described above. 2. Left posterior tenth rib fracture. 3. There is a tiny focus of air medially in the left chest described above which may represent a tiny focus of pleural air representing a tiny pneumothorax best seen on coronal image 63. Findings called to Dr. Donnald Garre. Electronically Signed   By: Gerome Sam III M.D   On: 10/03/2015 14:36   Ct Cervical Spine Wo Contrast  10/03/2015  CLINICAL DATA:  Status post fall 40 feet off a ladder. EXAM: CT CERVICAL SPINE WITHOUT CONTRAST TECHNIQUE: Multidetector CT imaging of the cervical spine was performed without intravenous contrast. Multiplanar CT image reconstructions were also generated. COMPARISON:  None. FINDINGS: The alignment is anatomic. There is a mild acute burst fracture of the C7 vertebral body with minimal anterior height loss without significant retropulsion of the posterior margin. There is a small acute nondisplaced fracture of the right anterior inferior corner of the C4 vertebral body. There is no static listhesis. The prevertebral soft tissues are normal. The intraspinal soft tissues are not fully imaged on this examination due to poor soft tissue contrast, but there is no gross soft tissue abnormality. Degenerative disc disease with disc height loss at C5-6. Broad-based disc osteophyte complex at C5-6 with bilateral uncovertebral degenerative changes and foraminal narrowing. The visualized portions of the lung apices demonstrate no focal abnormality. IMPRESSION: 1. Mild acute burst fracture of the C7 vertebral body with minimal anterior height loss without significant retropulsion of the posterior margin. 2. Acute nondisplaced fracture of the right anterior inferior corner of the C4 vertebral body. Electronically Signed   By: Elige Ko   On: 10/03/2015 14:13   Ct Abdomen Pelvis W Contrast  10/03/2015  CLINICAL DATA:  Pain after 40 foot fall. EXAM: CT CHEST, ABDOMEN, AND PELVIS WITH CONTRAST TECHNIQUE: Multidetector CT imaging  of the chest, abdomen and pelvis was performed following the standard protocol during bolus administration of intravenous contrast. CONTRAST:  ISOVUE-300 IOPAMIDOL (ISOVUE-300) INJECTION 61% COMPARISON:  None. FINDINGS: CT CHEST There is air in the neck just to the right and posterior to the trachea on series 4, image 8. There is also abnormal air in the subcarinal region as seen on series 4, image 54. There is a focus of gas in the mediastinum posterior to the aorta on series 4, image 107 and another on series 4, 120. There is a tiny amount of gas in the retrocrural region anterior to the aorta on series 3, image 48. A focus of air anterior to the aorta on sagittal image 91 could be a small amount of pleural air. No abnormal fluid collections are seen in the mediastinum. The remainder of the central airways are normal. There is no other evidence of pneumothorax. There is mild dependent atelectasis bilaterally. No suspicious pulmonary nodules, masses, or infiltrates are identified. The study was not tailored to evaluate the thoracic aorta but there is no aneurysm or dissection identified. No pleural or pericardial effusions. Mild coronary artery calcifications are seen. The heart is otherwise normal. No adenopathy is seen in the chest. CT ABDOMEN AND PELVIS Evaluation of the abdomen demonstrates a tiny amount  of retrocrural bear as described above. This is probably tracking inferiorly from the chest. No other abnormal air seen in the abdomen. No free fluid. The liver, gallbladder, portal vein, spleen, adrenal glands, kidneys, and pancreas are normal. The abdominal aorta is normal in caliber. No adenopathy. The stomach and small bowel are normal. The colon demonstrates mild fecal loading but is otherwise normal. The appendix is not well seen but there is no secondary evidence of appendicitis. The pelvis demonstrates no adenopathy or mass. The bladder is normal. Delayed imaging through the upper abdomen  demonstrates no filling defects in the upper renal collecting systems. There is a mildly displaced fracture of the left posterior tenth rib. Degenerative changes are seen at the right sternoclavicular joint. There are degenerative changes within the spine. No other fractures are identified. IMPRESSION: 1. There is abnormal air in the mediastinum. The largest pocket of air is in the subcarinal region adjacent to the trachea. However, there is no obvious source of air identified and no obvious injury to the esophagus or trachea is seen. Recommend clinical correlation and follow-up as clinically warranted. No abnormal fluid in the mediastinum. The air from the chest tracks inferiorly into the retrocrural region as described above. 2. Left posterior tenth rib fracture. 3. There is a tiny focus of air medially in the left chest described above which may represent a tiny focus of pleural air representing a tiny pneumothorax best seen on coronal image 63. Findings called to Dr. Donnald GarrePfeiffer. Electronically Signed   By: Gerome Samavid  Williams III M.D   On: 10/03/2015 14:36   Dg Pelvis Portable  10/03/2015  CLINICAL DATA:  Pain after fall EXAM: PORTABLE PELVIS 1-2 VIEWS COMPARISON:  None. FINDINGS: There is no evidence of pelvic fracture or diastasis. No pelvic bone lesions are seen. IMPRESSION: Negative. Electronically Signed   By: Gerome Samavid  Williams III M.D   On: 10/03/2015 12:40   Dg Chest Portable 1 View  10/03/2015  CLINICAL DATA:  Pain after fall EXAM: PORTABLE CHEST 1 VIEW COMPARISON:  None. FINDINGS: The cardiac silhouette is mildly enlarged. The hila are symmetric. The mediastinum is not well assessed on a portable film but thought to be within normal limits. No pneumothorax. No pulmonary nodules, masses, or focal infiltrates. No fractures are seen. IMPRESSION: 1. Cardiac size is not well assessed on a portable film but the cardiac silhouette does appear to be mildly enlarged. A PA and lateral chest x-ray could better  evaluate. 2. No other acute abnormalities are seen. Electronically Signed   By: Gerome Samavid  Williams III M.D   On: 10/03/2015 12:39    Assessment/Plan: Improving    Continue collar. Office f/u in 3-4 weeks with DrStern. Pt verbalizes understanding of plan.   Georgiann Cockeroteat, Odel Schmid 10/04/2015, 7:59 AM
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
## 2015-10-04 NOTE — Progress Notes (Signed)
Pt discharged to home.  Discharge instructions explained to pt.  Pt has no questions at the time of discharge. Pt states he has all belongings.  IV removed.  Pt discharge instructions were in with RN paperwork, so were left here at hospital.  Pt notified and stated someone would be to pick them up.
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
## 2015-10-04 NOTE — Discharge Instructions (Signed)
Keep collar on at all times.  No driving while taking hydrocodone.  Increase activity as pain allows.
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
